# Patient Record
Sex: Male | Born: 2000 | Race: White | Hispanic: No | Marital: Single | Smoking: Never smoker
Health system: Southern US, Community
[De-identification: ages and names within clinical notes are randomized; demographics above are authoritative.]

## PROBLEM LIST (undated history)

## (undated) DIAGNOSIS — F40298 Other specified phobia: Secondary | ICD-10-CM

## (undated) DIAGNOSIS — S022XXA Fracture of nasal bones, initial encounter for closed fracture: Secondary | ICD-10-CM

## (undated) DIAGNOSIS — G43909 Migraine, unspecified, not intractable, without status migrainosus: Secondary | ICD-10-CM

---
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
## 2022-09-24 IMAGING — DX DG CHEST 1V PORT
1 series · 1 of 1 positions shown · non-contrast
Comparison: None.

CLINICAL DATA: Vomiting, fever, cough and congestion

EXAM:
PORTABLE CHEST 1 VIEW

[chest]
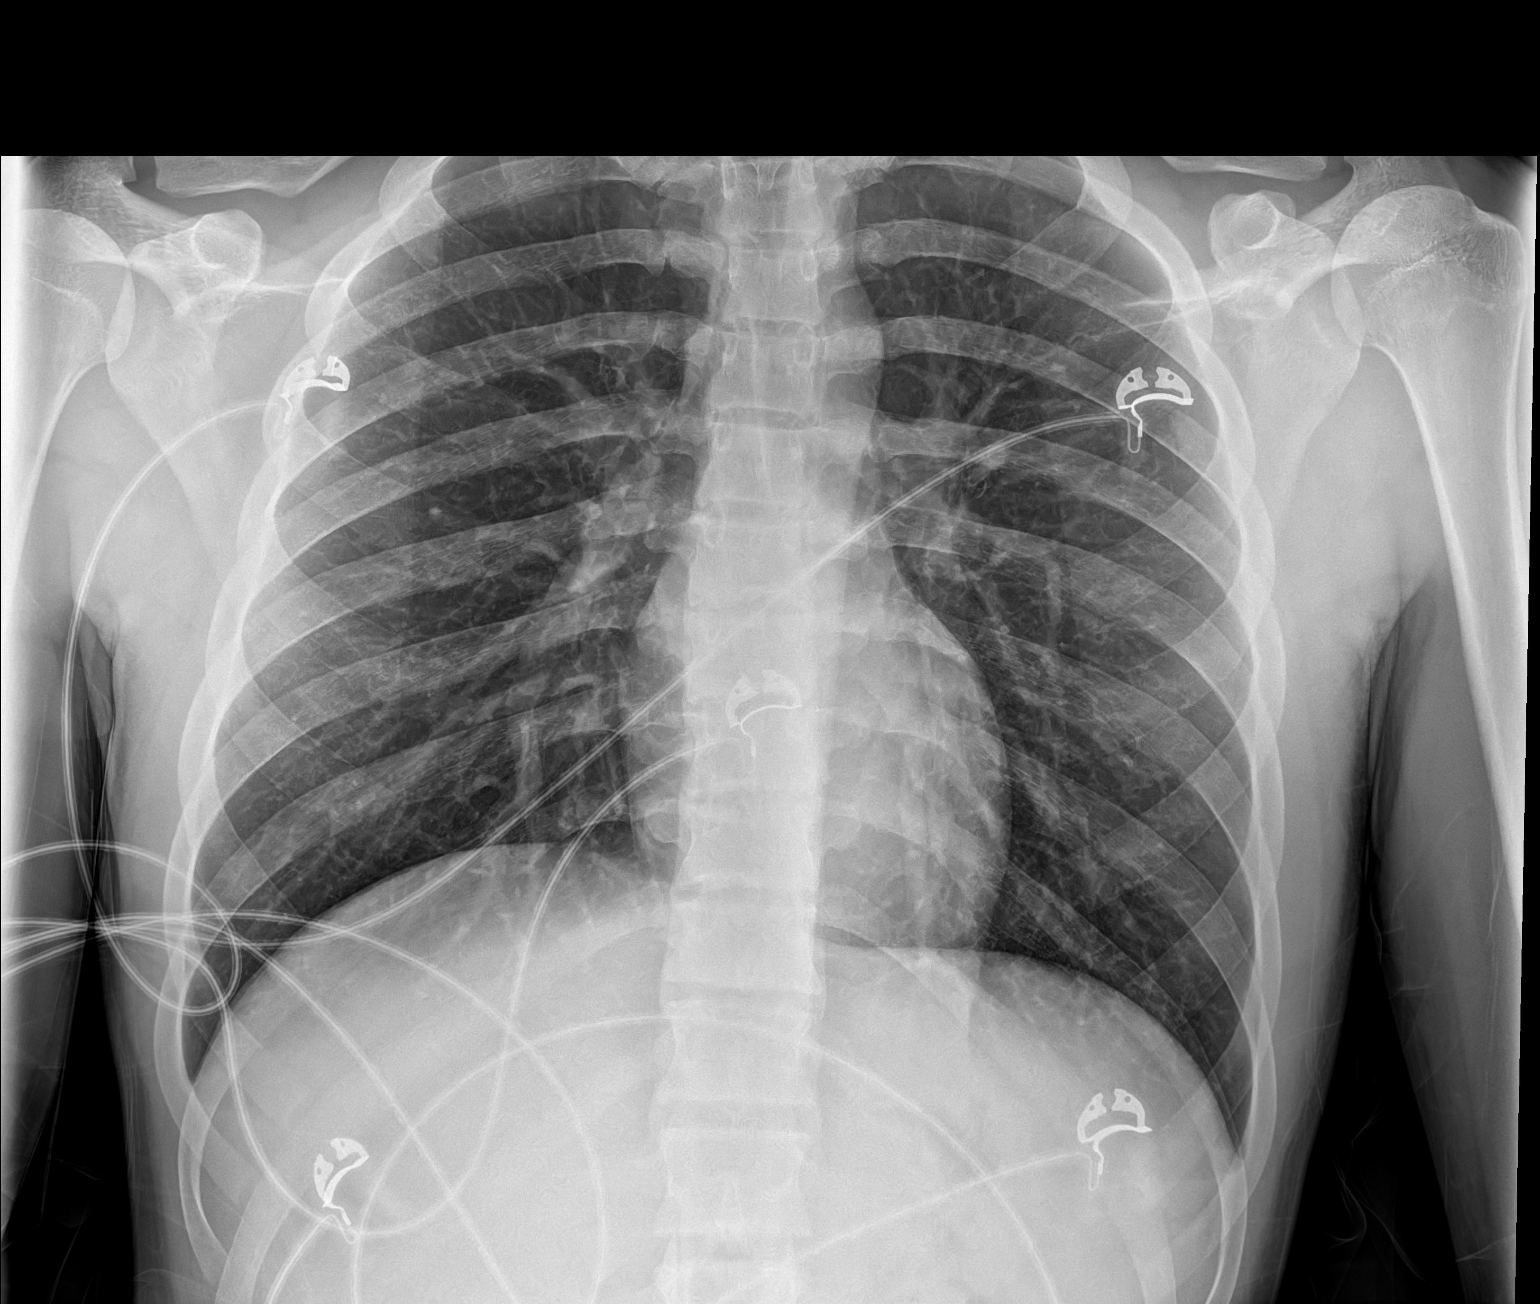

[1 of 1 positions shown; findings below may reference images not displayed]

FINDINGS: The cardiomediastinal contours are within normal limits. The lungs
are clear. No pneumothorax or pleural effusion. No acute finding in
the visualized skeleton.
IMPRESSION: No active disease.
